# Patient Record
Sex: Male | Born: 1955 | Hispanic: Yes | Marital: Married | Smoking: Never smoker
Health system: Southern US, Community
[De-identification: ages and names within clinical notes are randomized; demographics above are authoritative.]

## PROBLEM LIST (undated history)

## (undated) DIAGNOSIS — I10 Essential (primary) hypertension: Secondary | ICD-10-CM

---

## 2019-04-09 ENCOUNTER — Emergency Department (HOSPITAL_COMMUNITY)
Admission: EM | Admit: 2019-04-09 | Discharge: 2019-04-10 | Disposition: A | Discharge status: Home / Self Care | Payer: Self-pay | Attending: Emergency Medicine | Admitting: Emergency Medicine

## 2019-04-09 ENCOUNTER — Other Ambulatory Visit: Payer: Self-pay

## 2019-04-09 ENCOUNTER — Encounter (HOSPITAL_COMMUNITY): Payer: Self-pay

## 2019-04-09 ENCOUNTER — Emergency Department (HOSPITAL_COMMUNITY): Payer: Self-pay

## 2019-04-09 DIAGNOSIS — R072 Precordial pain: Secondary | ICD-10-CM | POA: Insufficient documentation

## 2019-04-09 DIAGNOSIS — R002 Palpitations: Secondary | ICD-10-CM | POA: Insufficient documentation

## 2019-04-09 DIAGNOSIS — I10 Essential (primary) hypertension: Secondary | ICD-10-CM | POA: Insufficient documentation

## 2019-04-09 HISTORY — DX: Essential (primary) hypertension: I10

## 2019-04-09 LAB — BASIC METABOLIC PANEL
Anion gap: 12 (ref 5–15)
BUN: 13 mg/dL (ref 8–23)
CO2: 21 mmol/L — ABNORMAL LOW (ref 22–32)
Calcium: 9.1 mg/dL (ref 8.9–10.3)
Chloride: 107 mmol/L (ref 98–111)
Creatinine, Ser: 0.74 mg/dL (ref 0.61–1.24)
GFR calc Af Amer: >60 mL/min (ref >60)
GFR calc non Af Amer: >60 mL/min (ref >60)
Glucose, Bld: 99 mg/dL (ref 70–99)
Potassium: 3.5 mmol/L (ref 3.5–5.1)
Sodium: 140 mmol/L (ref 135–145)

## 2019-04-09 LAB — CBC
HCT: 41.7 % (ref 39.0–52.0)
Hemoglobin: 14.2 g/dL (ref 13.0–17.0)
MCH: 29.7 pg (ref 26.0–34.0)
MCHC: 34.1 g/dL (ref 30.0–36.0)
MCV: 87.2 fL (ref 80.0–100.0)
Platelets: 238 10*3/uL (ref 150–400)
RBC: 4.78 MIL/uL (ref 4.22–5.81)
RDW: 12.5 % (ref 11.5–15.5)
WBC: 6.7 10*3/uL (ref 4.0–10.5)
nRBC: 0 % (ref 0.0–0.2)

## 2019-04-09 LAB — TROPONIN I: Troponin I: <0.03 ng/mL (ref <0.03)

## 2019-04-09 MED ORDER — AMLODIPINE BESYLATE 5 MG PO TABS: 5.0000 mg | ORAL_TABLET | Freq: Every day | ORAL | 0 refills | Status: AC

## 2019-04-09 MED ORDER — AMLODIPINE BESYLATE 5 MG PO TABS
5.0000 mg | ORAL_TABLET | Freq: Once | ORAL | Status: AC
  Administered 2019-04-09: 5 mg via ORAL
  Filled 2019-04-09: qty 1

## 2019-04-09 NOTE — ED Triage Notes (Signed)
Pt arrives POV for eval of abnormal EKG from PCP w/ new RBBB and new bifascicular block per notes. Pt reports 4-5 weeks of episodic palpitations. Today went to PCP for med refill, and told pcp about palpitations which prompted them to do a 12 lead. Pt endorses persistent chest pressure, states intermittent dull stabbing pain.

## 2019-04-09 NOTE — Discharge Instructions (Signed)

## 2019-04-10 NOTE — ED Provider Notes (Signed)
[facility] EMERGENCY DEPARTMENT Provider Note   CSN: [id number] Arrival date & time: 04/09/19  1642    History   Chief Complaint Chief Complaint  Patient presents with  . Chest Pain    HPI Eric Willis is a 63 y.o. male.      Chest Pain  Duration:  3 weeks Timing:  Constant Chronicity:  New Relieved by:  Nothing Worsened by:  Nothing Associated symptoms: palpitations   Associated symptoms: no cough, no diaphoresis, no fatigue, no fever, no shortness of breath, no syncope, no vomiting and no weakness   A 63 year old patient with a history of hypertension presents for evaluation of chest pain. Initial onset of pain was more than 6 hours ago. The patient's chest pain is described as heaviness/pressure/tightness and is not worse with exertion. The patient's chest pain is not middle- or left-sided, is not well-localized, is not sharp and does not radiate to the arms/jaw/neck. The patient does not complain of nausea and denies diaphoresis. The patient has no history of stroke, has no history of peripheral artery disease, has not smoked in the past 90 days, denies any history of treated diabetes, has no relevant family history of coronary artery disease (first degree relative at less than age 1), has no history of hypercholesterolemia and does not have an elevated BMI (>=30).    Patient was seen by PCP and sent here for evaluation of hypertensive emergency as well as abnormal EKG. Patient reports his pain is been consistent for at least 3 weeks.  It does appear to be worse at night and describes some palpitations He reports he works every day and has no increase in pain/shortness of breath/fatigue with work.  He exercises and has no issues.  Past Medical History:  Diagnosis Date  . Hypertension     There are no active problems to display for this patient.   History reviewed. No pertinent surgical history.      Home Medications    Prior to Admission  medications   Medication Sig Start Date End Date Taking? Authorizing Provider  amLODipine (NORVASC) 5 MG tablet Take 1 tablet (5 mg total) by mouth daily. 04/09/19   Zadie Rhine, MD    Family History History reviewed. No pertinent family history.  Social History Social History   Tobacco Use  . Smoking status: Never Smoker  . Smokeless tobacco: Never Used  Substance Use Topics  . Alcohol use: Not Currently  . Drug use: Not Currently     Allergies   Patient has no known allergies.   Review of Systems Review of Systems  Constitutional: Negative for diaphoresis, fatigue and fever.  Respiratory: Negative for cough and shortness of breath.   Cardiovascular: Positive for chest pain and palpitations. Negative for syncope.  Gastrointestinal: Negative for vomiting.  Neurological: Negative for syncope and weakness.  All other systems reviewed and are negative.    Physical Exam Updated Vital Signs BP (!) 163/70   Pulse 61   Temp 98 F (36.7 C) (Oral)   Resp 19   Ht 1.702 m ( )   Wt 86.2 kg   SpO2 98%   BMI 29.76 kg/m   Physical Exam CONSTITUTIONAL: Well developed/well nourished HEAD: Normocephalic/atraumatic EYES: EOMI/PERRL ENMT: mask in place NECK: supple no meningeal signs SPINE/BACK:entire spine nontender CV: S1/S2 noted, no murmurs/rubs/gallops noted LUNGS: Lungs are clear to auscultation bilaterally, no apparent distress ABDOMEN: soft, nontender, no rebound or guarding, bowel sounds noted throughout abdomen GU:no cva tenderness NEURO: Pt  is awake/alert/appropriate, moves all extremitiesx4.  No facial droop.   EXTREMITIES: pulses normal/equal, full ROM, no calf tenderness SKIN: warm, color normal PSYCH: no abnormalities of mood noted, alert and oriented to situation   ED Treatments / Results  Labs (all labs ordered are listed, but only abnormal results are displayed) Labs Reviewed  BASIC METABOLIC PANEL - Abnormal; Notable for the following  components:      Result Value   CO2 21 (*)    All other components within normal limits  CBC  TROPONIN I    EKG EKG Interpretation  Date/Time:  Tuesday Apr 09 2019 22:59:11 EDT Ventricular Rate:  60 PR Interval:  148 QRS Duration: 158 QT Interval:  465 QTC Calculation: 465 R Axis:   -33 Text Interpretation:  Sinus rhythm Right bundle branch block Abnormal ekg Confirmed by Zadie RhineWickline, Minnie Legros ([id number]) on 04/09/2019 11:13:23 PM   Radiology Dg Chest 2 View  Result Date: 04/09/2019 CLINICAL DATA:  Chest pain and palpitations. New right bundle branch block. EXAM: CHEST - 2 VIEW COMPARISON:  None. FINDINGS: The heart size and mediastinal contours are within normal limits. Normal pulmonary vascularity. No focal consolidation, pleural effusion, or pneumothorax. No acute osseous abnormality. IMPRESSION: No active cardiopulmonary disease. Electronically Signed   By: Obie DredgeWilliam T Derry M.D.   On: 04/09/2019 17:16    Procedures Procedures (including critical care time)  Medications Ordered in ED Medications  amLODipine (NORVASC) tablet 5 mg (5 mg Oral Given 04/09/19 2338)     Initial Impression / Assessment and Plan / ED Course  I have reviewed the triage vital signs and the nursing notes.  Pertinent labs & imaging results that were available during my care of the patient were reviewed by me and considered in my medical decision making (see chart for details).        12:32 AM Patient sent in by PCP for chest pain and abnormal EKG.  Patient tells me that his pain has been consistent for several weeks.  It is never worse with exertion and he is able to do his daily activities without issue.  He reports it does appear worse at night with mild palpitations.  No syncope reported.  He is very well-appearing at this time.  His troponin is negative.  My suspicion for ACS/dissection/PE is low.  There is no tachycardia or hypoxia to suggest PE  His PCP was concerned for hypertensive urgency, but  this time I feel this is low likelihood.  Suspect his HTN is chronic Apparent there was a mixup with his medications that he has not been on BP meds for quite some time  Per his PCP notes, his EKG has changed since 2018.  It is unclear how acute these changes have become.  EKGs tonight spaced over 6 hrs did not reveal any dynamic change     This point I feel patient stable for discharge home. He is agreeable with plan. He had been on Norvasc before, will start this.  He will follow-up with PCP this week for continued blood pressure management.  He may benefit from a cardiology evaluation at some point due to his abnormal EKG with RBBB.  However has had no syncope or shortness of breath that require emergent evaluation  We discussed strict ER return precautions Final Clinical Impressions(s) / ED Diagnoses   Final diagnoses:  Essential hypertension  Precordial pain    ED Discharge Orders         Ordered    amLODipine (NORVASC) 5 MG  tablet  Daily     04/09/19 2330           Zadie Rhine, MD 04/10/19 [phone]

## 2020-05-10 IMAGING — CR CHEST - 2 VIEW
2 series · 2 of 2 positions shown · non-contrast
Comparison: None.

CLINICAL DATA: Chest pain and palpitations. New right bundle branch
block.

EXAM:
CHEST - 2 VIEW

[chest pa]
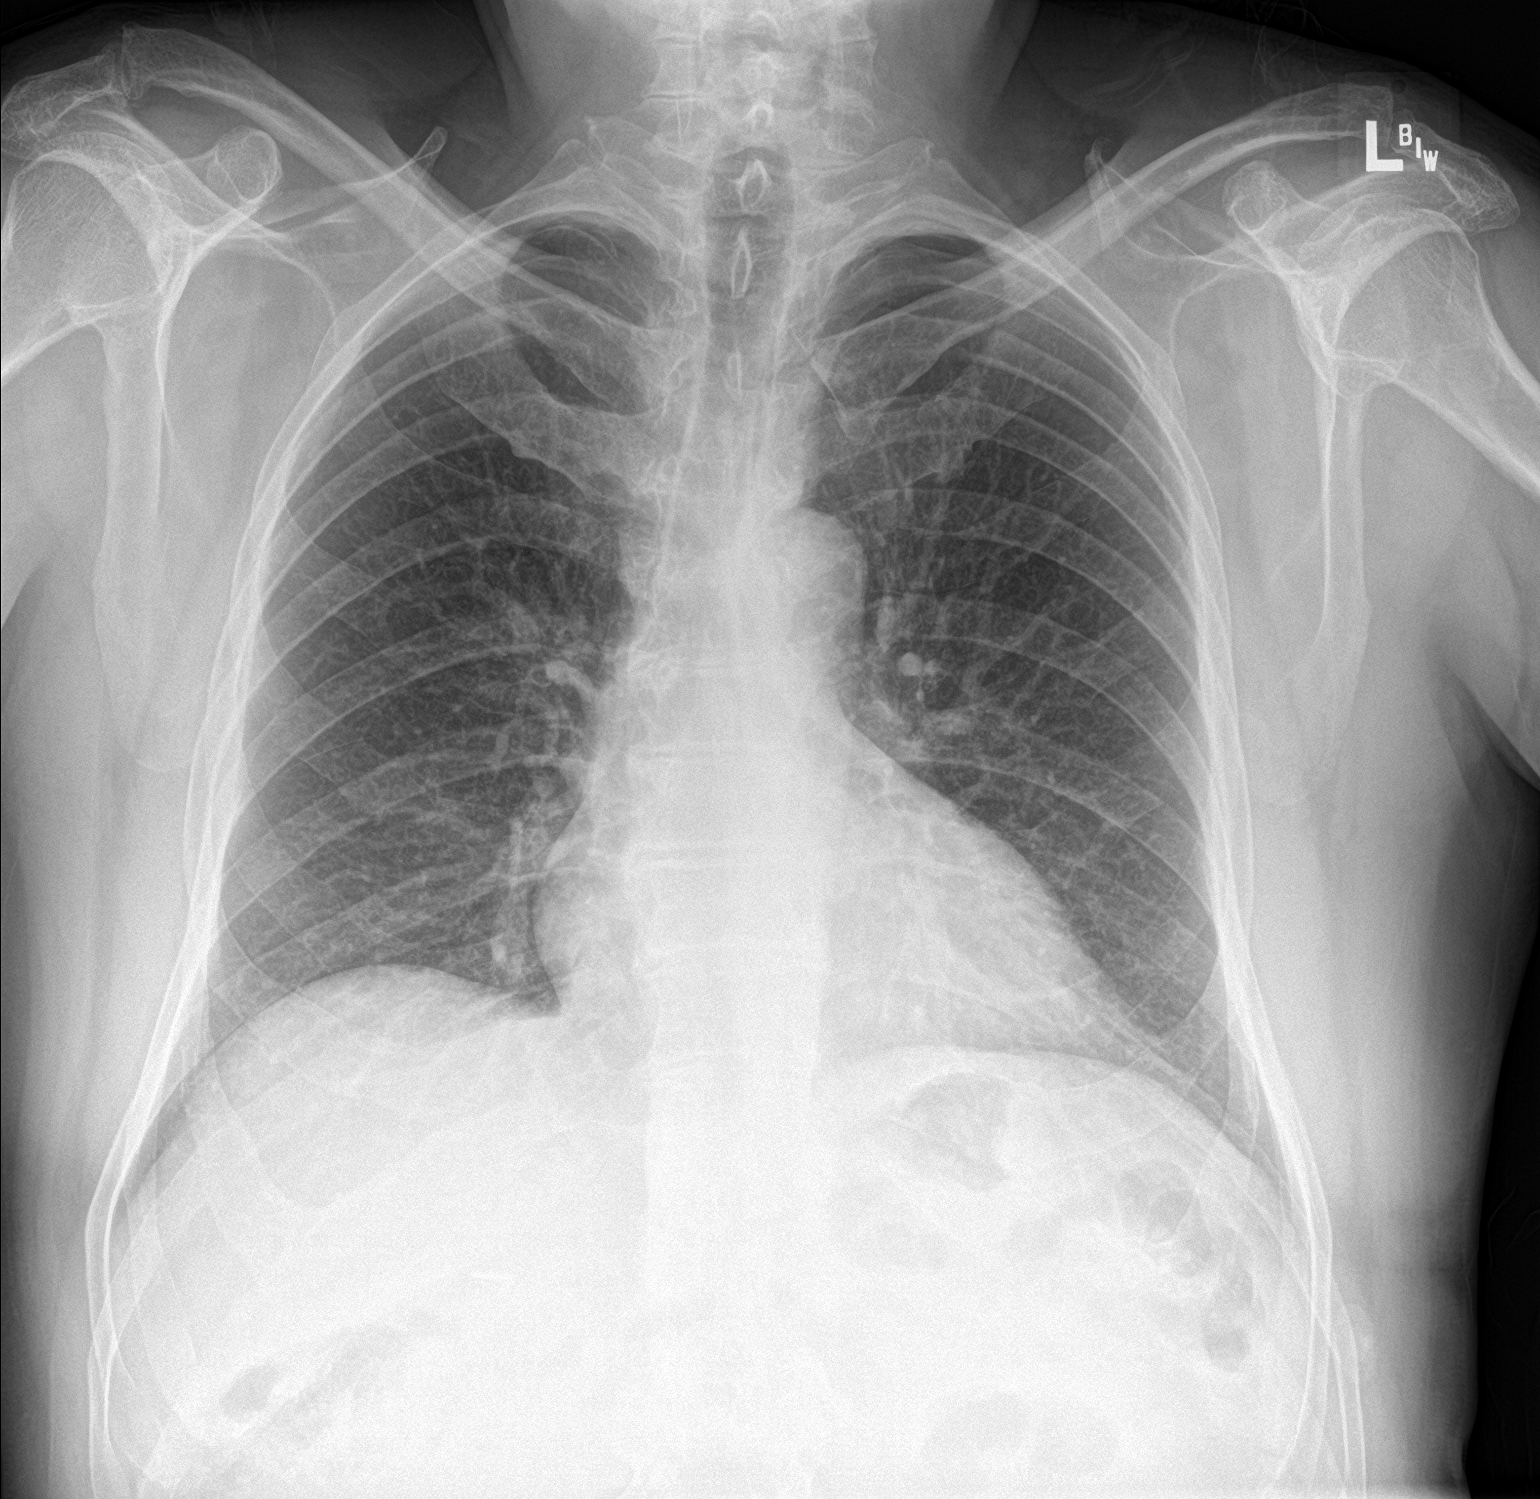

[chest lat]
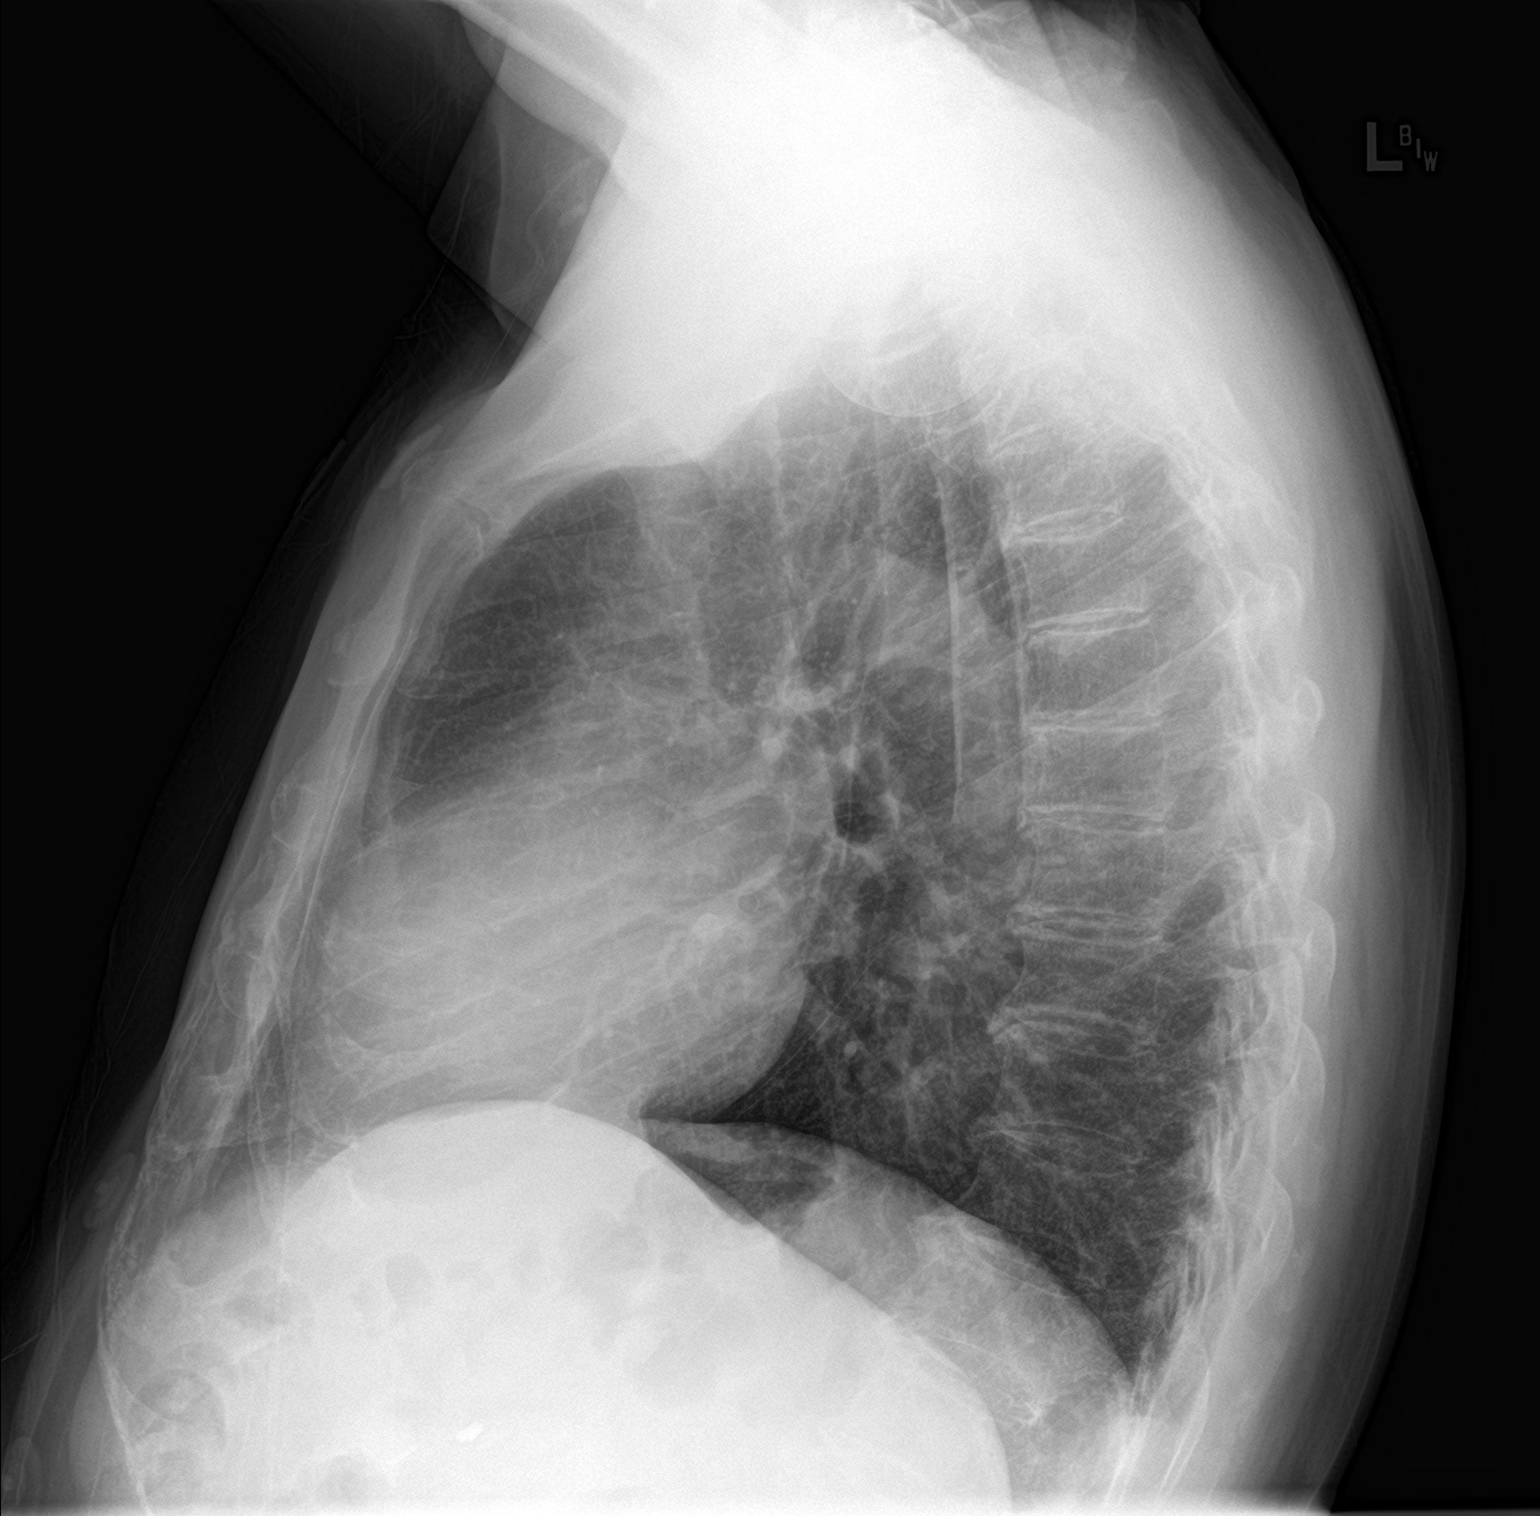

[2 of 2 positions shown; findings below may reference images not displayed]

FINDINGS: The heart size and mediastinal contours are within normal limits.
Normal pulmonary vascularity. No focal consolidation, pleural
effusion, or pneumothorax. No acute osseous abnormality.
IMPRESSION: No active cardiopulmonary disease.

## 2020-06-14 DEATH — deceased
